# Patient Record
Sex: Male | Born: 1975 | Race: White | Hispanic: No | Marital: Married | Smoking: Current some day smoker
Health system: Southern US, Community
[De-identification: ages and names within clinical notes are randomized; demographics above are authoritative.]

## PROBLEM LIST (undated history)

## (undated) DIAGNOSIS — G4730 Sleep apnea, unspecified: Secondary | ICD-10-CM

## (undated) HISTORY — PX: FRACTURE SURGERY: SHX138

---

## 2014-06-23 ENCOUNTER — Encounter: Payer: Self-pay | Admitting: Emergency Medicine

## 2014-06-23 ENCOUNTER — Emergency Department: Payer: No Typology Code available for payment source

## 2014-06-23 ENCOUNTER — Emergency Department
Admission: EM | Admit: 2014-06-23 | Discharge: 2014-06-23 | Disposition: A | Discharge status: Home / Self Care | Payer: No Typology Code available for payment source | Attending: Emergency Medicine | Admitting: Emergency Medicine

## 2014-06-23 DIAGNOSIS — S6991XA Unspecified injury of right wrist, hand and finger(s), initial encounter: Secondary | ICD-10-CM | POA: Diagnosis present

## 2014-06-23 DIAGNOSIS — S60221A Contusion of right hand, initial encounter: Secondary | ICD-10-CM | POA: Diagnosis not present

## 2014-06-23 DIAGNOSIS — Z720 Tobacco use: Secondary | ICD-10-CM | POA: Insufficient documentation

## 2014-06-23 DIAGNOSIS — Y998 Other external cause status: Secondary | ICD-10-CM | POA: Diagnosis not present

## 2014-06-23 DIAGNOSIS — Y9389 Activity, other specified: Secondary | ICD-10-CM | POA: Insufficient documentation

## 2014-06-23 DIAGNOSIS — Y9289 Other specified places as the place of occurrence of the external cause: Secondary | ICD-10-CM | POA: Diagnosis not present

## 2014-06-23 DIAGNOSIS — W231XXA Caught, crushed, jammed, or pinched between stationary objects, initial encounter: Secondary | ICD-10-CM | POA: Insufficient documentation

## 2014-06-23 HISTORY — DX: Sleep apnea, unspecified: G47.30

## 2014-06-23 MED ORDER — IBUPROFEN 800 MG PO TABS: 800.0000 mg | ORAL_TABLET | Freq: Three times a day (TID) | ORAL | Status: DC | PRN

## 2014-06-23 NOTE — ED Notes (Signed)
Patient states his daughter shut car door on his hand. Now having pain and swelling to right hand.

## 2014-06-23 NOTE — ED Provider Notes (Signed)
CSN: [id number]     Arrival date & time 06/23/14  1811 History   None    Chief Complaint  Patient presents with  . Hand Injury     (Consider location/radiation/quality/duration/timing/severity/associated sxs/prior Treatment) HPI around noon today patient's right hand was slammed in a door of his car by his daughter accidentally denies having pain and swelling to the area denies any numbness tingling weakness says pain and swelling is mildly increased throughout the day's work advised him to come get it evaluated rates pain at about a 5-6 out of 10 at the worst worse with touch and movement denies any other associated signs or symptoms  Past Medical History  Diagnosis Date  . Sleep apnea    Past Surgical History  Procedure Laterality Date  . Fracture surgery     History reviewed. No pertinent family history. History  Substance Use Topics  . Smoking status: Current Some Day Smoker  . Smokeless tobacco: Never Used  . Alcohol Use: No    Review of Systems  Constitutional: No fever/chills Eyes: No visual changes. ENT: No sore throat. Cardiovascular: Denies chest pain. Respiratory: Denies shortness of breath. Gastrointestinal: No abdominal pain.  No nausea, no vomiting.  No diarrhea.  No constipation. Genitourinary: Negative for dysuria. Musculoskeletal: Negative for back pain. Skin: Negative for rash. Neurological: Negative for headaches, focal weakness or numbness. tive. Negative 6 systems as best review the patient's upper noted in history of present illness Allergies  Review of patient's allergies indicates no known allergies.  Home Medications   Prior to Admission medications   Medication Sig Start Date End Date Taking? Authorizing Provider  ibuprofen (ADVIL,MOTRIN) 800 MG tablet Take 1 tablet (800 mg total) by mouth every 8 (eight) hours as needed. 06/23/14   Corin Tilly William C Tyrika Newman, PA-C   BP 151/90 mmHg  Pulse 89  Temp(Src) 98.7 F (37.1 C) (Oral)  Resp 18  Ht  6\' 1"  (1.854 m)  Wt 250 lb (113.399 kg)  BMI 32.99 kg/m2  SpO2 99% Physical Exam  Caucasian male appearing stated well-developed well-nourished no acute distress vitals were reviewed from nursing note Head ears eyes nose neck and throat exam unremarkable Cardiovascular regular rate and rhythm no murmurs rubs gallops good cap refill good distal pulses Pulmonary lungs clear to auscultation bilaterally Neuro exam is nonfocal good sensation throughout the extremity Skin is free of rash or disease or bruising Musculoskeletal pain with palpation over fourth and fifth metacarpals to the right hand without palpable deformity or abnormality and mild swelling   ED Course  Procedures (including critical care time)  Labs Reviewed - No data to display  Imaging Review Dg Hand Complete Right  06/23/2014   CLINICAL DATA:  Right hand shut in door today with right hand pain and swelling.  EXAM: RIGHT HAND - COMPLETE 3+ VIEW  COMPARISON:  None.  FINDINGS: There is no evidence of fracture or dislocation. There is no evidence of arthropathy or other focal bone abnormality. Soft tissues are unremarkable.  IMPRESSION: Negative.   Electronically Signed   By: Harmon PierJeffrey  Hu M.D.   On: 06/23/2014 19:06      MDM  Patient has negative x-rays Ace wrap was applied Motrin for pain and ice to the area follow-up as needed with orthopedics return for any acute concerns or worsening symptoms Final diagnoses:  Hand contusion, right, initial encounter        Keimya Briddell Rosalyn GessWilliam C Magda Muise, PA-C 06/23/14 1955  Sharyn CreamerMark Quale, MD 06/23/14 [phone]

## 2014-06-23 NOTE — Discharge Instructions (Signed)
Contusion °A contusion is a deep bruise. Contusions are the result of an injury that caused bleeding under the skin. The contusion may turn blue, purple, or yellow. Minor injuries will give you a painless contusion, but more severe contusions may stay painful and swollen for a few weeks.  °CAUSES  °A contusion is usually caused by a blow, trauma, or direct force to an area of the body. °SYMPTOMS  °· Swelling and redness of the injured area. °· Bruising of the injured area. °· Tenderness and soreness of the injured area. °· Pain. °DIAGNOSIS  °The diagnosis can be made by taking a history and physical exam. An X-ray, CT scan, or MRI may be needed to determine if there were any associated injuries, such as fractures. °TREATMENT  °Specific treatment will depend on what area of the body was injured. In general, the best treatment for a contusion is resting, icing, elevating, and applying cold compresses to the injured area. Over-the-counter medicines may also be recommended for pain control. Ask your caregiver what the best treatment is for your contusion. °HOME CARE INSTRUCTIONS  °· Put ice on the injured area. °¨ Put ice in a plastic bag. °¨ Place a towel between your skin and the bag. °¨ Leave the ice on for 15-20 minutes, 3-4 times a day, or as directed by your health care provider. °· Only take over-the-counter or prescription medicines for pain, discomfort, or fever as directed by your caregiver. Your caregiver may recommend avoiding anti-inflammatory medicines (aspirin, ibuprofen, and naproxen) for 48 hours because these medicines may increase bruising. °· Rest the injured area. °· If possible, elevate the injured area to reduce swelling. °SEEK IMMEDIATE MEDICAL CARE IF:  °· You have increased bruising or swelling. °· You have pain that is getting worse. °· Your swelling or pain is not relieved with medicines. °MAKE SURE YOU:  °· Understand these instructions. °· Will watch your condition. °· Will get help right  away if you are not doing well or get worse. °Document Released: 11/05/2004 Document Revised: 01/31/2013 Document Reviewed: 12/01/2010 °[employer]® Patient Information ©2015 [employer]. This information is not intended to replace advice given to you by your health care provider. Make sure you discuss any questions you have with your health care provider. ° °

## 2016-10-15 ENCOUNTER — Encounter: Payer: Self-pay | Admitting: Emergency Medicine

## 2016-10-15 ENCOUNTER — Emergency Department
Admission: EM | Admit: 2016-10-15 | Discharge: 2016-10-15 | Disposition: A | Discharge status: Home / Self Care | Payer: Worker's Compensation | Attending: Emergency Medicine | Admitting: Emergency Medicine

## 2016-10-15 ENCOUNTER — Emergency Department: Payer: Worker's Compensation

## 2016-10-15 DIAGNOSIS — M25562 Pain in left knee: Secondary | ICD-10-CM

## 2016-10-15 DIAGNOSIS — F17210 Nicotine dependence, cigarettes, uncomplicated: Secondary | ICD-10-CM | POA: Insufficient documentation

## 2016-10-15 MED ORDER — IBUPROFEN 800 MG PO TABS: 800.0000 mg | ORAL_TABLET | Freq: Three times a day (TID) | ORAL | 0 refills | Status: AC | PRN

## 2016-10-15 MED ORDER — IBUPROFEN 800 MG PO TABS
800.0000 mg | ORAL_TABLET | Freq: Once | ORAL | Status: AC
  Administered 2016-10-15: 800 mg via ORAL
  Filled 2016-10-15: qty 1

## 2016-10-15 NOTE — ED Triage Notes (Signed)
Patient states that he was running at work and started to fall and caught himself on the rail. Patient states that he felt his left knee pop.

## 2016-10-15 NOTE — ED Notes (Signed)
Per Rolene CourseSean Bermal (Supervisor) no WC drug screening need at this time

## 2016-10-15 NOTE — ED Provider Notes (Signed)
[facility]-EMERGENCY DEPARTMENT Provider Note   CSN: [id number] Arrival date & time: 10/15/16  2156     History   Chief Complaint Chief Complaint  Patient presents with  . Knee Pain    HPI Stephen Moody is a 41 y.o. [name]. Patient was at work earlier today, going down the steps as he felt a pop in his left knee medial joint line. Patient states he felt the knee twist but did not buckle and give way. He's had prior meniscal "sprain" that was treated nonoperatively. Patient was doing well prior to going down the steps today. Patient states he did not develop much pain or swelling until a few minutes after the accident. He denies any other injury to his body. He has not had a medication for pain. Pain is moderate.  HPI  Past Medical History:  Diagnosis Date  . Sleep apnea     There are no active problems to display for this patient.   Past Surgical History:  Procedure Laterality Date  . FRACTURE SURGERY         Home Medications    Prior to Admission medications   Medication Sig Start Date End Date Taking? Authorizing Provider  ibuprofen (ADVIL,MOTRIN) 800 MG tablet Take 1 tablet (800 mg total) by mouth every 8 (eight) hours as needed. 10/15/16   Evon SlackGaines, Osmond Steckman C, PA-C    Family History No family history on file.  Social History Social History  Substance Use Topics  . Smoking status: Current Some Day Smoker  . Smokeless tobacco: Never Used  . Alcohol use No     Allergies   Patient has no known allergies.   Review of Systems Review of Systems  Constitutional: Negative.   Respiratory: Negative for shortness of breath.   Cardiovascular: Negative for leg swelling.  Musculoskeletal: Positive for arthralgias and gait problem.  Skin: Negative for color change, rash and wound.  Neurological: Negative for dizziness and headaches.  Hematological: Negative for adenopathy.  Psychiatric/Behavioral: Negative for  agitation and behavioral problems.     Physical Exam Updated Vital Signs BP (!) 141/94 (BP Location: Left Arm)   Pulse (!) 104   Temp 98.3 F (36.8 C) (Oral)   Resp 18   Ht 6\' 1"  (1.854 m)   Wt 108.9 kg (240 lb)   SpO2 96%   BMI 31.66 kg/m   Physical Exam  Constitutional: He appears well-developed and well-nourished.  HENT:  Head: Normocephalic and atraumatic.  Neck: Normal range of motion.  Pulmonary/Chest: Effort normal. No respiratory distress.  Musculoskeletal:  Examination of the left lower extremity shows mild swelling of the knee with mild signs of effusion. No skin breakdown noted. He is able to straight leg raise. Range of motion 0-95, pain with hyperflexion. Mild pain with valgus stress testing with slight laxity of the symmetrical with the contralateral leg. Patella tracks well. He has painful hyperflexion, painful McMurray's test reproduces the medial joint line pain. Normal internal Rotation of the left hip.  Skin: Skin is warm and dry.  Psychiatric: He has a normal mood and affect. His behavior is normal. Judgment and thought content normal.     ED Treatments / Results  Labs (all labs ordered are listed, but only abnormal results are displayed) Labs Reviewed - No data to display  EKG  EKG Interpretation None       Radiology Dg Knee Complete 4 Views Left  Result Date: 10/15/2016 CLINICAL DATA:  Tripped on stairs, landing on the left knee. Heard a pop. Discomfort with bending and rotating the knee. EXAM: LEFT KNEE - COMPLETE 4+ VIEW COMPARISON:  None. FINDINGS: No evidence of fracture, dislocation, or joint effusion. No evidence of arthropathy or other focal bone abnormality. Soft tissues are unremarkable. IMPRESSION: Negative. Electronically Signed   By: Burman Nieves M.D.   On: 10/15/2016 22:56    Procedures Procedures (including critical care time) SPLINT APPLICATION Date/Time: 11:43 PM Authorized by: Patience Musca Consent: Verbal  consent obtained. Risks and benefits: risks, benefits and alternatives were discussed Consent given by: patient Splint applied by: nurse Location details: Left leg  Splint type: Immobilizer  Supplies used: Knee immobilizer  Post-procedure: The splinted body part was neurovascularly unchanged following the procedure. Patient tolerance: Patient tolerated the procedure well with no immediate complications.     Medications Ordered in ED Medications  ibuprofen (ADVIL,MOTRIN) tablet 800 mg (800 mg Oral Given 10/15/16 2336)     Initial Impression / Assessment and Plan / ED Course  I have reviewed the triage vital signs and the nursing notes.  Pertinent labs & imaging results that were available during my care of the patient were reviewed by me and considered in my medical decision making (see chart for details).     41 year old male with mechanical knee pain and left knee. Developed sharp catching pain with going down the steps. Has had mild swelling with pain with hyperflexion of positive McMurray sign. X-ray show no evidence of acute bony [employer]. Concern for medial meniscal tear. Shawnie Dapper into knee immobilizer, will have patient avoid squatting pivoting and stairs. His weight-bearing as tolerated. Patient will rest ice and elevate the knee. Ibuprofen as needed for pain. Follow-up with orthopedics.    Final Clinical Impressions(s) / ED Diagnoses   Final diagnoses:  Mechanical knee pain, left    New Prescriptions New Prescriptions   IBUPROFEN (ADVIL,MOTRIN) 800 MG TABLET    Take 1 tablet (800 mg total) by mouth every 8 (eight) hours as needed.     Evon Slack, PA-C 10/15/16 Gwendlyn Deutscher, [place], MD 10/16/16 [phone]

## 2016-10-15 NOTE — Discharge Instructions (Signed)
Please rest ice and elevate the left knee. Avoid squatting, pivoting and twisting. He may ambulate as tolerated. Please take ibuprofen 800 mg with food every 6-8 hours as needed. He may return to work with light duty restrictions, recommend avoiding squatting pivoting and stairs. Please follow-up with workers, physician or orthopedist.

## 2016-10-15 NOTE — ED Notes (Signed)

## 2018-09-22 IMAGING — DX DG KNEE COMPLETE 4+V*L*
4 series · 4 of 4 positions shown · non-contrast
Comparison: None.

CLINICAL DATA: Tripped on stairs, landing on the left knee. Heard a
pop. Discomfort with bending and rotating the knee.

EXAM:
LEFT KNEE - COMPLETE 4+ VIEW

[knee ap]
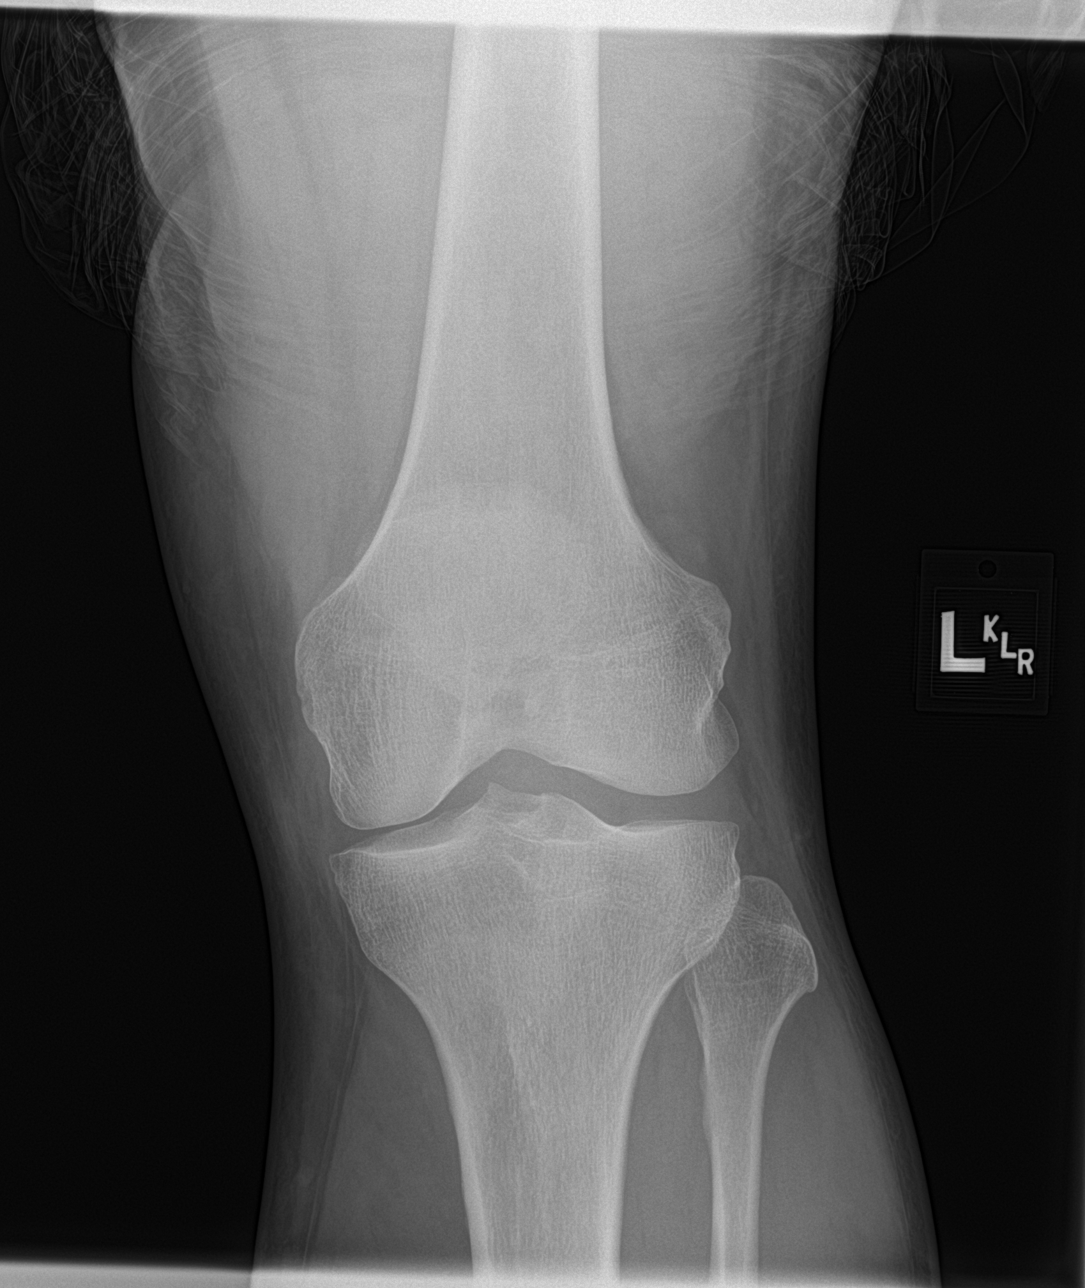

[knee lat]
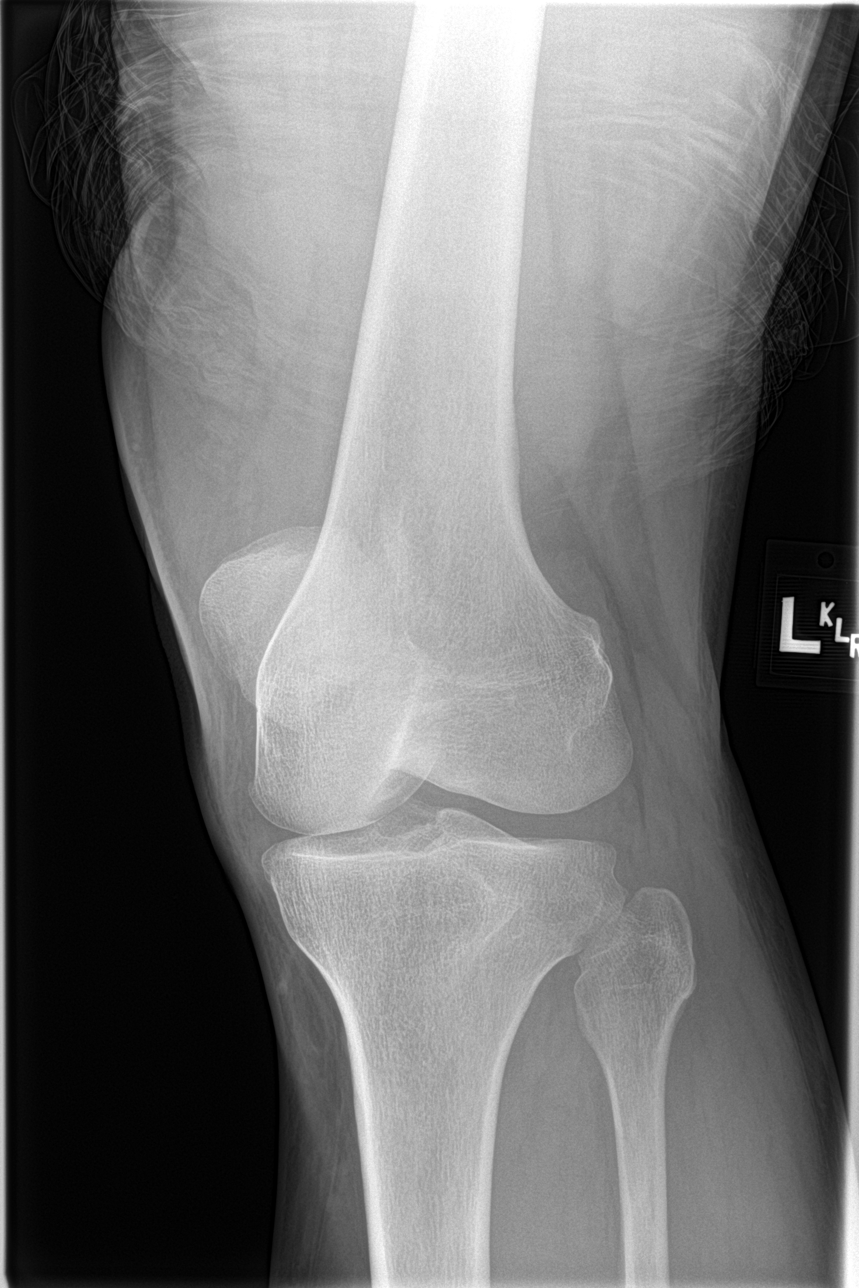

[knee obl (1 of 2)]
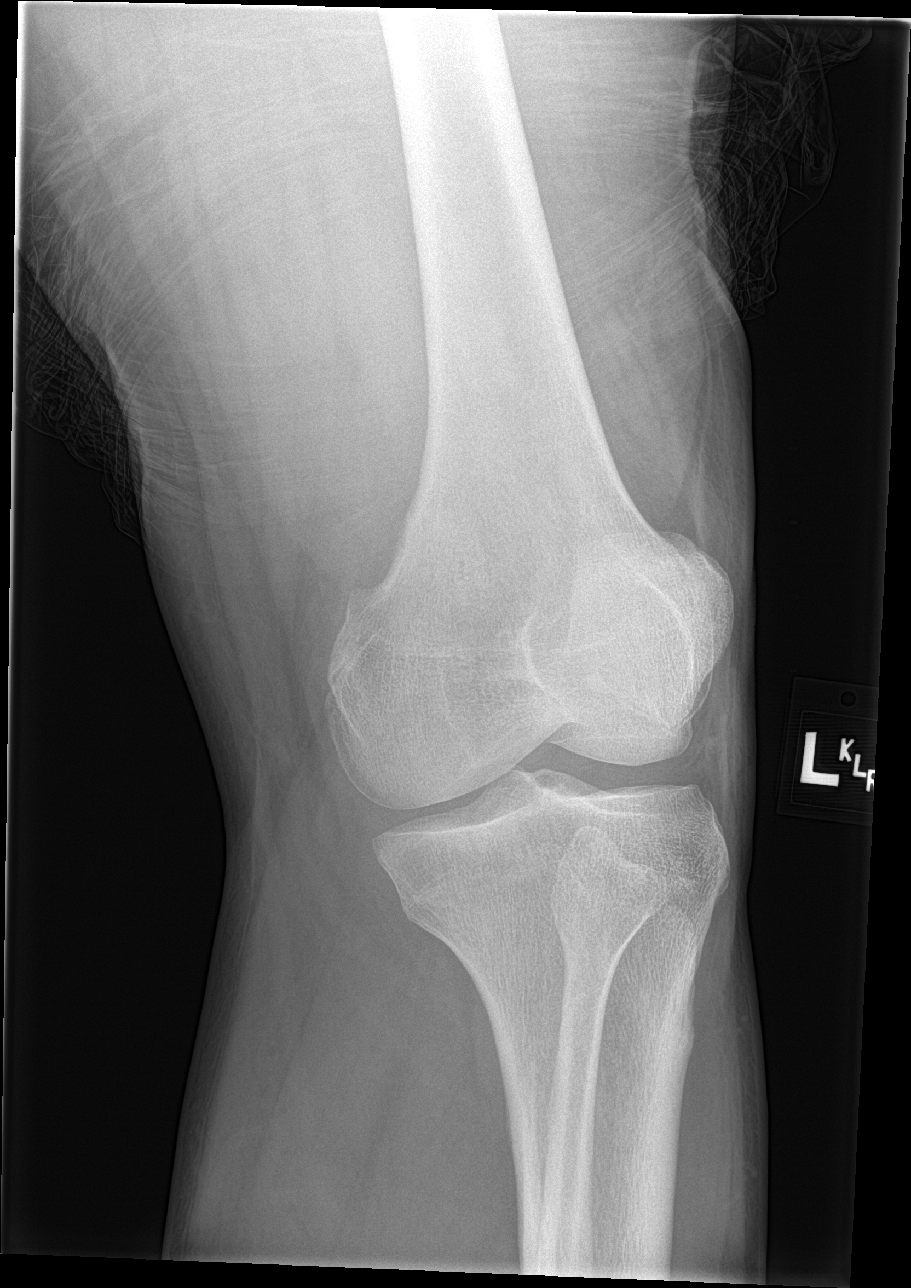

[knee obl (2 of 2)]
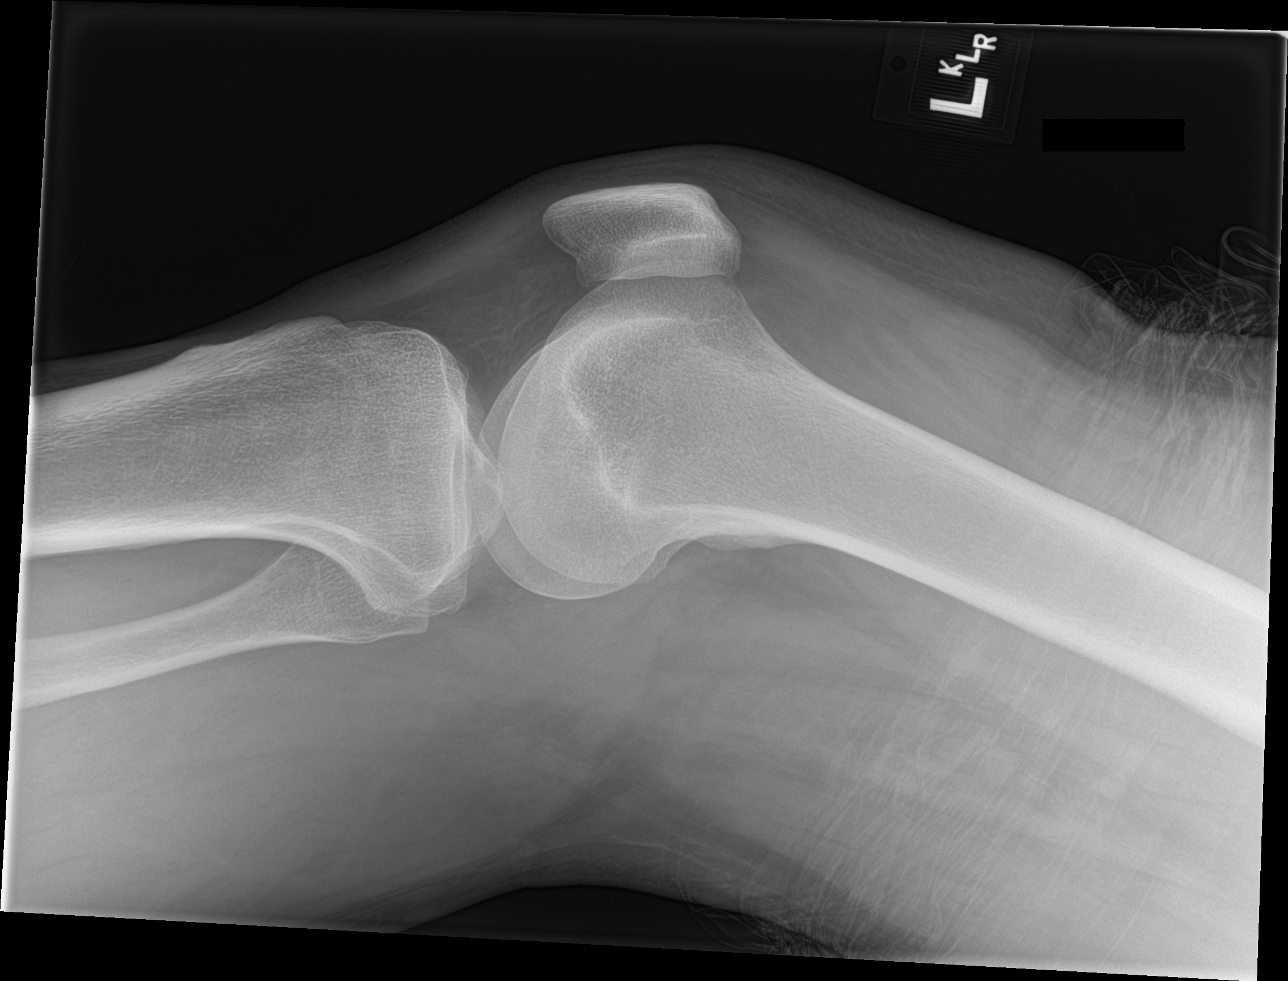

[4 of 4 positions shown; findings below may reference images not displayed]

FINDINGS: No evidence of fracture, dislocation, or joint effusion. No evidence
of arthropathy or other focal bone abnormality. Soft tissues are
unremarkable.
IMPRESSION: Negative.
